# Patient Record
Sex: Male | Born: 1985 | Race: Black or African American | Hispanic: No | Marital: Single
Health system: Midwestern US, Community
[De-identification: ages and names within clinical notes are randomized; demographics above are authoritative.]

## PROBLEM LIST (undated history)

## (undated) DIAGNOSIS — E291 Testicular hypofunction: Secondary | ICD-10-CM

## (undated) DIAGNOSIS — G4700 Insomnia, unspecified: Secondary | ICD-10-CM

## (undated) DIAGNOSIS — K219 Gastro-esophageal reflux disease without esophagitis: Secondary | ICD-10-CM

## (undated) DIAGNOSIS — I7300 Raynaud's syndrome without gangrene: Secondary | ICD-10-CM

## (undated) DIAGNOSIS — F419 Anxiety disorder, unspecified: Secondary | ICD-10-CM

## (undated) DIAGNOSIS — R61 Generalized hyperhidrosis: Secondary | ICD-10-CM

## (undated) DIAGNOSIS — I10 Essential (primary) hypertension: Secondary | ICD-10-CM

## (undated) HISTORY — DX: Essential (primary) hypertension: I10

## (undated) HISTORY — DX: Generalized hyperhidrosis: R61

## (undated) HISTORY — DX: Anxiety disorder, unspecified: F41.9

## (undated) HISTORY — DX: Gastro-esophageal reflux disease without esophagitis: K21.9

## (undated) HISTORY — PX: OTHER SURGICAL HISTORY: SHX169

## (undated) HISTORY — DX: Testicular hypofunction: E29.1

## (undated) HISTORY — DX: Raynaud's syndrome without gangrene: I73.00

## (undated) HISTORY — DX: Insomnia, unspecified: G47.00

---

## 2004-10-07 ENCOUNTER — Emergency Department (HOSPITAL_COMMUNITY): Admission: EM | Admit: 2004-10-07 | Discharge: 2004-10-07 | Payer: Self-pay | Admitting: Emergency Medicine
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
## 2014-08-20 ENCOUNTER — Encounter: Payer: Self-pay | Admitting: Endocrinology

## 2014-10-25 DIAGNOSIS — S0501XA Injury of conjunctiva and corneal abrasion without foreign body, right eye, initial encounter: Secondary | ICD-10-CM
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
## 2014-10-25 NOTE — ED Notes (Signed)
Eye irrigation in eye wash station. Pt tolerated well.

## 2014-10-26 ENCOUNTER — Inpatient Hospital Stay
Admit: 2014-10-26 | Discharge: 2014-10-26 | Disposition: A | Discharge status: Home / Self Care | Payer: Self-pay | Attending: Emergency Medicine

## 2014-10-26 MED ORDER — FLUORESCEIN 1 MG EYE STRIPS
1 mg | OPHTHALMIC | Status: AC
Start: 2014-10-26 — End: 2014-10-25
  Administered 2014-10-26: 04:00:00 via OPHTHALMIC

## 2014-10-26 MED ORDER — HYDROCODONE-ACETAMINOPHEN 5 MG-325 MG TAB
5-325 mg | ORAL_TABLET | ORAL | Status: DC | PRN
Start: 2014-10-26 — End: 2019-08-25

## 2014-10-26 MED ORDER — SODIUM CHLORIDE 0.9 % IV
INTRAVENOUS | Status: DC
Start: 2014-10-26 — End: 2014-10-26

## 2014-10-26 MED ORDER — ERYTHROMYCIN 5 MG/G EYE OINTMENT
5 mg/gram (0. %) | OPHTHALMIC | Status: AC
Start: 2014-10-26 — End: 2014-11-01

## 2014-10-26 MED ORDER — TETRACAINE HCL (PF) 0.5 % EYE DROPS
0.5 % | OPHTHALMIC | Status: AC
Start: 2014-10-26 — End: 2014-10-25
  Administered 2014-10-26: 04:00:00 via OPHTHALMIC

## 2014-10-26 MED FILL — SODIUM CHLORIDE 0.9 % IV: INTRAVENOUS | Qty: 1000

## 2014-10-26 MED FILL — FLUORESCEIN 1 MG EYE STRIPS: 1 mg | OPHTHALMIC | Qty: 1

## 2014-10-26 MED FILL — TETRACAINE HCL (PF) 0.5 % EYE DROPS: 0.5 % | OPHTHALMIC | Qty: 2

## 2014-10-26 NOTE — ED Notes (Signed)
Pt d/c by provider. Pt not in room.

## 2014-10-26 NOTE — ED Notes (Signed)
Irrigated r eye with morgan lenses c  1 Liter ns. Pt tolerated well

## 2019-08-25 ENCOUNTER — Emergency Department: Admit: 2019-08-25 | Payer: Worker's Compensation | Primary: Family Medicine

## 2019-08-25 ENCOUNTER — Inpatient Hospital Stay
Admit: 2019-08-25 | Discharge: 2019-08-25 | Disposition: A | Discharge status: Home / Self Care | Payer: Worker's Compensation | Attending: Student in an Organized Health Care Education/Training Program

## 2019-08-25 DIAGNOSIS — S46212A Strain of muscle, fascia and tendon of other parts of biceps, left arm, initial encounter: Secondary | ICD-10-CM
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
## 2019-08-25 NOTE — ED Notes (Signed)
Pt given discharge instructions by Dr Anderson

## 2019-08-25 NOTE — ED Triage Notes (Signed)
Pt ambulated to the treatment area with a steady gait. Pt states "On Wednesday I believe I caused a torn bicep in the left arm while repelling." Pt appears in no distress at this time.

## 2019-08-25 NOTE — ED Notes (Signed)
 Pt ambulated to the treatment area with a steady gait. Pt states On Wednesday I believe I caused a torn bicep in the left arm while repelling. Pt appears in no distress at this time.

## 2019-08-25 NOTE — ED Notes (Signed)
 Pt given discharge instructions by Dr Dareen Piano
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
## 2020-12-10 LAB — HEPATIC FUNCTION PANEL
ALT: 143 — AB (ref 10–40)
AST: 69 — AB (ref 14–40)

## 2020-12-10 LAB — BASIC METABOLIC PANEL
BUN: 12 (ref 4–21)
Creatinine: 0.9 (ref 0.6–1.3)

## 2020-12-10 LAB — CBC AND DIFFERENTIAL
HCT: 46 (ref 41–53)
Hemoglobin: 15.2 (ref 13.5–17.5)

## 2020-12-10 LAB — TESTOSTERONE: Testosterone: 169.9

## 2020-12-10 LAB — PSA: PSA: 0.94

## 2021-01-14 ENCOUNTER — Ambulatory Visit: Payer: Self-pay | Admitting: "Endocrinology

## 2021-01-27 ENCOUNTER — Encounter: Payer: Self-pay | Admitting: "Endocrinology

## 2021-01-27 ENCOUNTER — Other Ambulatory Visit: Payer: Self-pay

## 2021-01-27 ENCOUNTER — Ambulatory Visit (INDEPENDENT_AMBULATORY_CARE_PROVIDER_SITE_OTHER): Payer: BC Managed Care – PPO | Admitting: "Endocrinology

## 2021-01-27 VITALS — BP 118/78 | HR 68 | Ht 65.75 in | Wt 226.2 lb

## 2021-01-27 DIAGNOSIS — E291 Testicular hypofunction: Secondary | ICD-10-CM | POA: Diagnosis not present

## 2021-01-27 MED ORDER — SYRINGE/NEEDLE (DISP) 21G X 1-1/2" 3 ML MISC
1 refills | Status: AC
Start: 2021-01-27 — End: ?

## 2021-01-27 NOTE — Patient Instructions (Signed)

## 2021-01-27 NOTE — Progress Notes (Signed)
01/27/2021               Endocrinology Consult Note   Chad Hardy, 35 y.o., male   Chief Complaint  Patient presents with   Establish Care    Hypogonadism     Past Medical History:  Diagnosis Date   Anxiety    GERD (gastroesophageal reflux disease)    Hyperhidrosis    Hypertension    Hypogonadism in male    Insomnia    Raynaud's phenomenon    Past Surgical History:  Procedure Laterality Date   bicep tendon surgery     endoscopic thoracic sympathectomy     ruptured pectoral tendon surgery     Social History   Socioeconomic History   Marital status: Single    Spouse name: Not on file   Number of children: Not on file   Years of education: Not on file   Highest education level: Not on file  Occupational History   Not on file  Tobacco Use   Smoking status: Never   Smokeless tobacco: Not on file  Vaping Use   Vaping Use: Never used  Substance and Sexual Activity   Alcohol use: Never   Drug use: Never   Sexual activity: Not on file  Other Topics Concern   Not on file  Social History Narrative   Not on file   Social Determinants of Health   Financial Resource Strain: Not on file  Food Insecurity: Not on file  Transportation Needs: Not on file  Physical Activity: Not on file  Stress: Not on file  Social Connections: Not on file   Outpatient Encounter Medications as of 01/27/2021  Medication Sig   SYRINGE-NEEDLE, DISP, 3 ML 21G X 1-1/2" 3 ML MISC Use to inject testosterone every week   amLODipine (NORVASC) 5 MG tablet Take 1 tablet by mouth daily.   ELIQUIS 5 MG TABS tablet Take 1 tablet by mouth 2 (two) times daily.   ibuprofen (ADVIL) 800 MG tablet TAKE 1 TABLET BY MOUTH THREE TIMES A DAY AS NEEDED FOR 30 DAYS   pantoprazole (PROTONIX) 40 MG tablet Take 1 tablet by mouth 2 (two) times daily.   testosterone cypionate (DEPOTESTOSTERONE CYPIONATE) 200 MG/ML injection Inject 0.5 mLs into the muscle once a week.    [DISCONTINUED] HYDROcodone-acetaminophen (NORCO/VICODIN) 5-325 MG tablet Take 1 tablet by mouth every 6 (six) hours as needed.   No facility-administered encounter medications on file as of 01/27/2021.   ALLERGIES: Allergies  Allergen Reactions   Pletal [Cilostazol] Nausea And Vomiting   Augmentin [Amoxicillin-Pot Clavulanate] Palpitations    SOB, panic attack    VACCINATION STATUS:  There is no immunization history on file for this patient.  HPI: Chad Hardy is a 35 y.o.-year-old man, being seen in consultation for evaluation and management of low testosterone requested by by his   provider  Lorelei Pont, DO.  He was found to have hypogonadism at approximate age of 45 5 years ago.  He was treated with various doses of testosterone over the years.  He is currently on 0.75 mL of 200 mg/mL testosterone cypionate (150 mg ) twice a week which translates to 1200 mg of testosterone monthly. His recent labs show total testosterone of 169.9 ng/dl.  His explaining proper injection of testosterone.  He still feels that his libido is down.  He is depressed, has "low energy".  He did not complain of ED.   He denies  trauma to testes,  chemotherapy,  testicular irradiation,  nor genitourinary surgery. No h/o cryptorchidism. He denies history of  mumps orchitis/ history of  autoimmune disorders.  He grew and went through puberty like his peers.  He does not father any children, not tried, not interested to keep his fertility.    No incomplete/delayed sexual development.     No breast discomfort/gynecomastia. No abnormal sense of smell . No hot flushes. No vision problems.  No report of changing headaches. He reports hypogonadism in his brothers as well as his father.     No Family history of hemochromatosis or pituitary tumors. No recent rapid weight change.  He is being worked up for rheumatologic conditions, his recent serum ANA titer was high 1: 2560. -His medication list included  hydrocodone, however he says he did not take it in more than a month.  He denies any prior chronic use of opioids, androgens, nor steroids.  No history of alcohol abuse.  No history of head injuries. 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  He has well-controlled blood pressure with on amlodipine.  He has family history of hypertension, diabetes, high cholesterol.  His recent labs showed transaminitis with ALT of 143, AST 69 .  ROS: Constitutional:  + Minimally fluctuating body weight,  + fatigue, no subjective hyperthermia/hypothermia Eyes: no blurry vision, no xerophthalmia ENT: no sore throat, no nodules palpated in throat, no dysphagia/odynophagia, no hoarseness Cardiovascular: no CP/SOB/palpitations/leg swelling Respiratory: no cough/SOB Gastrointestinal: no N/V/D/C Musculoskeletal: no muscle/joint aches Skin: no rashes Neurological: no tremors/numbness/tingling/dizziness Psychiatric: no depression/anxiety  PE: BP 118/78   Pulse 68   Ht 5' 5.75" (1.67 m)   Wt 226 lb 3.2 oz (102.6 kg)   BMI 36.79 kg/m  Wt Readings from Last 3 Encounters:  01/27/21 226 lb 3.2 oz (102.6 kg)   Constitutional:  Body mass index is 36.79 kg/m.,  not in acute distress, + Normal State of Mind Eyes: PERRLA, EOMI, no exophthalmos ENT: moist mucous membranes, no thyromegaly, no cervical lymphadenopathy Cardiovascular: RRR, No MRG Respiratory: CTA B Gastrointestinal: abdomen soft, NT, ND, BS+ Musculoskeletal: no deformities, strength intact in all 4 Skin: moist, warm, no rashes Neurological: no tremor with outstretched hands, DTR normal in all 4 Genital exam: normal male escutcheon, no inguinal LAD, normal phallus, testes ~25 mL, no testicular masses, no penile discharge.  No gynecomastia.   CMP ( most recent) CMP     Component Value Date/Time   BUN 12 12/10/2020 0000   CREATININE 0.9 12/10/2020 0000   AST 69 (A) 12/10/2020 0000   ALT 143 (A) 12/10/2020  0000    Recent Results (from the past 2160 hour(s))  CBC and differential     Status: None   Collection Time: 12/10/20 12:00 AM  Result Value Ref Range   Hemoglobin 15.2 13.5 - 17.5   HCT 46 41 - 53  Basic metabolic panel     Status: None   Collection Time: 12/10/20 12:00 AM  Result Value Ref Range   BUN 12 4 - 21   Creatinine 0.9 0.6 - 1.3  Hepatic function panel     Status: Abnormal   Collection Time: 12/10/20 12:00 AM  Result Value Ref Range   ALT 143 (A) 10 - 40   AST 69 (A) 14 - 40  PSA     Status: None   Collection Time: 12/10/20 12:00 AM  Result Value Ref Range   PSA 0.94   Testosterone     Status: None   Collection Time: 12/10/20 12:00  AM  Result Value Ref Range   Testosterone 169.9      ASSESSMENT: 1. Hypogonadism 2.  Transaminitis  PLAN:  I have examined the patient, reviewed his labs and have had a long discussion with the patient regarding his testosterone results.   - I have discussed potential causes of hypogonadism, diagnosis of hypogonadism,  and relevant workup to confirm the diagnosis of hypogonadism before initiating testosterone replacement therapy.  -The circumstances of his diagnosed with hypogonadism are not available to review.  It would have been useful if he had documented gonadotropins, prolactin to establish etiology before treatment was initiated.  However, he is already on treatment.  He is current serum level of total testosterone is not indicative of 1200 mg of testosterone monthly.  -I had a long discussion with the patient about safe use of testosterone, avoiding unnecessary supplements in light of the fact that he has transaminitis and history of DVT.  -I discussed and lowered his dose to testosterone cypionate 100 mg IM every 7 days to give him 400 mg IM monthly versus 1200 mg IM monthly he is doing currently.  Before his next visit, he will call for fasting labs for testosterone (total and free), SHBG; FSH/LH; prolactin, ferritin in a  morning sample of serum.    -If this workup indicates  Secondary etiology for hypogonadism, he will be considered for MRI imaging of sella/pituitary .  -He is made aware of the fact that testosterone supplement will diminish his chance of fertility, voices understanding and reports that he has no plan to keep his fertility.  -In light of his contraindications including transaminitis, and DVT, he will be considered for low normal testosterone total levels, unnecessary or unsafe to achieve high normal or supraphysiologic testosterone levels.   -He will benefit the most from weight loss.  We briefly discussed about controlling his diet, improving his sleep, and exercise regularly.   - he acknowledges that there is a room for improvement in his food and drink choices. - Suggestion is made for him to avoid simple carbohydrates  from his diet including Cakes, Sweet Desserts, Ice Cream, Soda (diet and regular), Sweet Tea, Candies, Chips, Cookies, Store Bought Juices, Alcohol in Excess of  1-2 drinks a day, Artificial Sweeteners,  Coffee Creamer, and "Sugar-free" Products, Lemonade. This will help patient to have more stable blood glucose profile and potentially avoid unintended weight gain.  -Plant predominant, whole foods lifestyle nutrition is discussed and suggested to him.  -Syringes were prescribed for him, does have enough testosterone supplies at home.    -He is advised to maintain close follow-up with his PMD.     - Time spent with the patient: 60 minutes, of which >50% was spent in obtaining information about his symptoms, reviewing his previous labs, evaluations, and treatments, counseling him about his hypogonadism, transaminitis, history of DVT, and developing a plan to confirm the diagnosis and long term treatment as necessary.  Chad Hardy participated in the discussions, expressed understanding, and voiced agreement with the above plans.  All questions were answered to his  satisfaction. he is encouraged to contact clinic should he have any questions or concerns prior to his return visit.  Return in about 3 months (around 04/29/2021) for Fasting Labs  in AM B4 8.  Marquis Lunch, MD [facility] [address] [place], [place] [address] Phone: [phone]  Fax: [phone]   01/27/2021, 3:24 PM  This note was partially dictated with voice recognition  software. Similar sounding words can be transcribed inadequately or may not  be corrected upon review.

## 2021-04-29 ENCOUNTER — Ambulatory Visit: Payer: BC Managed Care – PPO | Admitting: "Endocrinology
